# Patient Record
Sex: Male | Born: 1975 | Race: White | Hispanic: No | Marital: Single | Smoking: Never smoker
Health system: Southern US, Community
[De-identification: ages and names within clinical notes are randomized; demographics above are authoritative.]

---

## 2010-11-01 ENCOUNTER — Emergency Department: Payer: Self-pay | Admitting: Internal Medicine

## 2011-02-07 ENCOUNTER — Emergency Department: Payer: Self-pay | Admitting: Emergency Medicine

## 2011-03-01 ENCOUNTER — Emergency Department: Payer: Self-pay | Admitting: Internal Medicine

## 2011-03-01 LAB — CBC
HGB: 15.5 g/dL (ref 13.0–18.0)
MCH: 32.8 pg (ref 26.0–34.0)
MCV: 96 fL (ref 80–100)
Platelet: 144 10*3/uL — ABNORMAL LOW (ref 150–440)
RBC: 4.71 10*6/uL (ref 4.40–5.90)
RDW: 12.9 % (ref 11.5–14.5)

## 2011-03-01 LAB — COMPREHENSIVE METABOLIC PANEL
Anion Gap: 10 (ref 7–16)
Bilirubin,Total: 0.2 mg/dL (ref 0.2–1.0)
Calcium, Total: 9.1 mg/dL (ref 8.5–10.1)
Chloride: 107 mmol/L (ref 98–107)
Co2: 26 mmol/L (ref 21–32)
Creatinine: 0.84 mg/dL (ref 0.60–1.30)
EGFR (Non-African Amer.): >60
Osmolality: 284 (ref 275–301)
Potassium: 4.4 mmol/L (ref 3.5–5.1)
SGOT(AST): 27 U/L (ref 15–37)
SGPT (ALT): 36 U/L

## 2011-03-01 LAB — URIC ACID: Uric Acid: 6.6 mg/dL (ref 3.5–7.2)

## 2011-03-01 LAB — URINALYSIS, COMPLETE
Bilirubin,UR: NEGATIVE
Blood: NEGATIVE
Glucose,UR: NEGATIVE mg/dL (ref 0–75)
Ketone: NEGATIVE
Ph: 7 (ref 4.5–8.0)
Protein: NEGATIVE

## 2011-03-22 ENCOUNTER — Emergency Department: Payer: Self-pay | Admitting: *Deleted

## 2011-09-24 ENCOUNTER — Emergency Department: Payer: Self-pay | Admitting: Emergency Medicine

## 2011-09-24 LAB — COMPREHENSIVE METABOLIC PANEL
Anion Gap: 10 (ref 7–16)
BUN: 14 mg/dL (ref 7–18)
Calcium, Total: 9.3 mg/dL (ref 8.5–10.1)
Chloride: 108 mmol/L — ABNORMAL HIGH (ref 98–107)
Co2: 24 mmol/L (ref 21–32)
EGFR (African American): >60
Potassium: 3.7 mmol/L (ref 3.5–5.1)
SGOT(AST): 17 U/L (ref 15–37)

## 2011-09-24 LAB — CBC
HGB: 14.9 g/dL (ref 13.0–18.0)
MCH: 31.7 pg (ref 26.0–34.0)
MCHC: 34.2 g/dL (ref 32.0–36.0)
MCV: 93 fL (ref 80–100)
Platelet: 197 10*3/uL (ref 150–440)
RBC: 4.7 10*6/uL (ref 4.40–5.90)

## 2012-02-02 IMAGING — CT CT HEAD WITHOUT CONTRAST
2 series · 16 of 30 positions shown, 20 images · non-contrast
Comparison: none

REASON FOR EXAM: seizure
COMMENTS:

PROCEDURE:     CT  - CT HEAD WITHOUT CONTRAST  - November 01, 2010  [DATE]
RESULT:     Comparison:  None
TECHNIQUE: Multiple axial images from the foramen magnum to the vertex were
obtained without IV contrast.

[Series 2: without · axial · non-contrast · 0.43mm/px · z∈[-133,-3]mm · 13 of 32 slices shown, 17 images]
[im 3/32  brain]
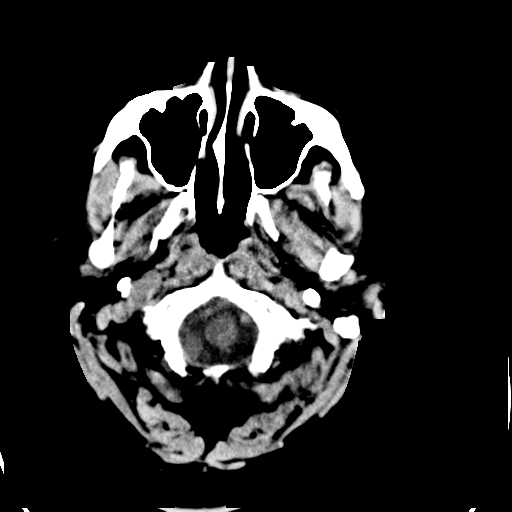
[im 3/32  bone]
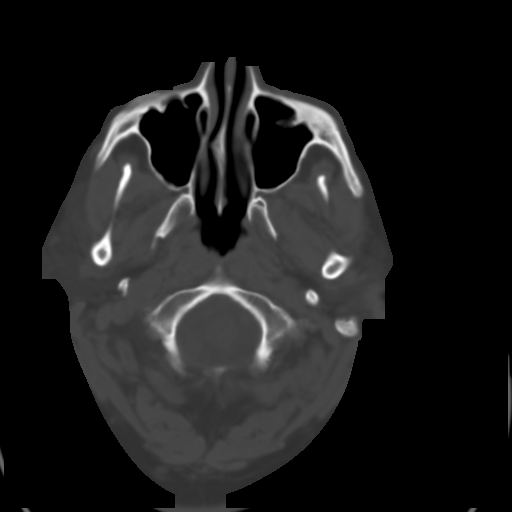
[im 5/32  brain]
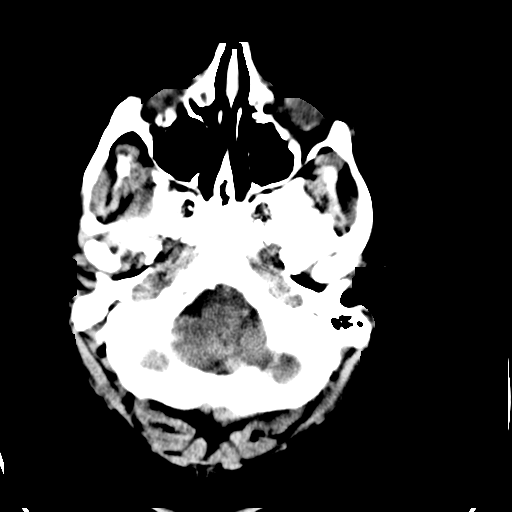
[im 7/32  brain]
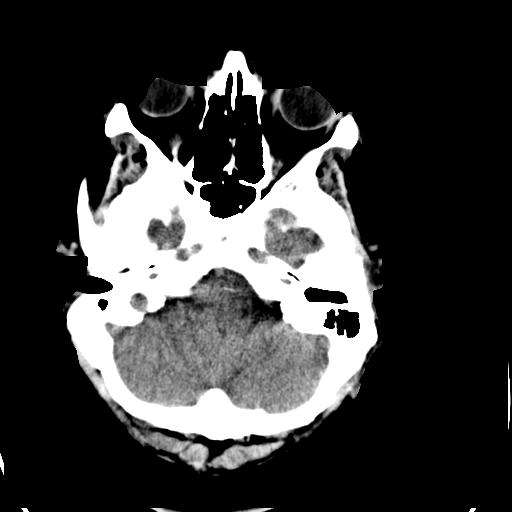
[im 9/32  brain]
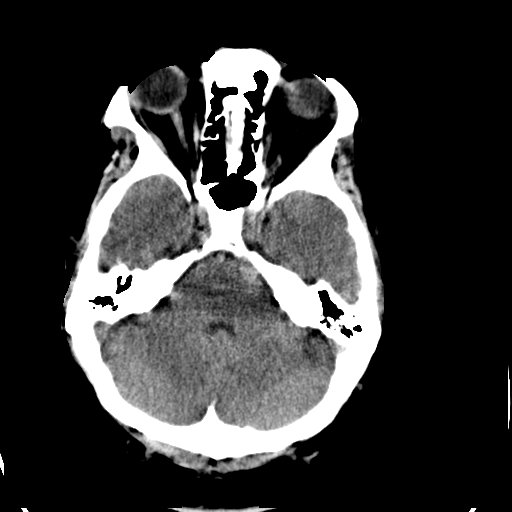
[im 12/32  brain]
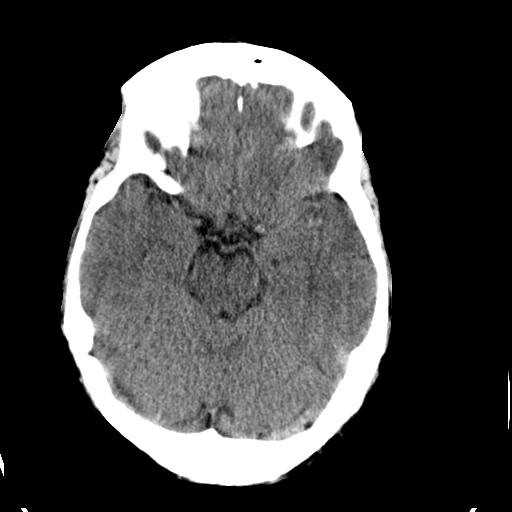
[im 12/32  bone]
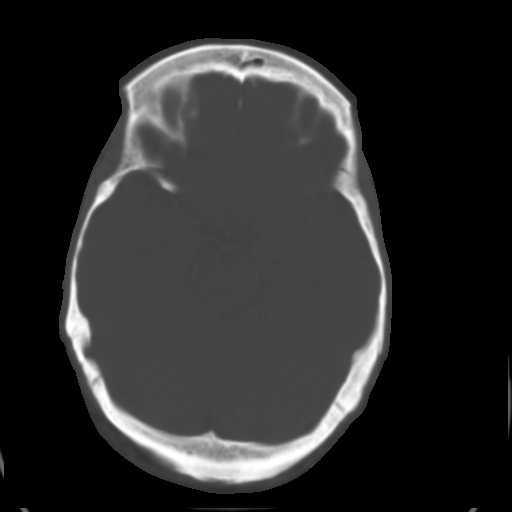
[im 14/32  brain]
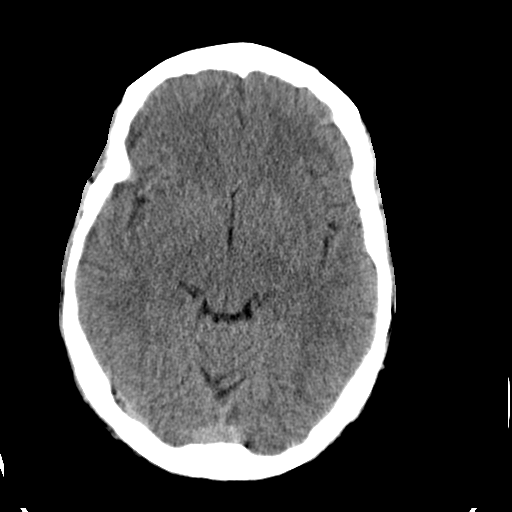
[im 16/32  brain]
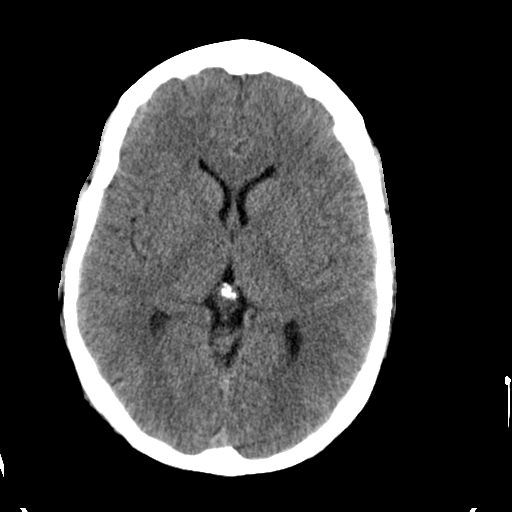
[im 18/32  brain]
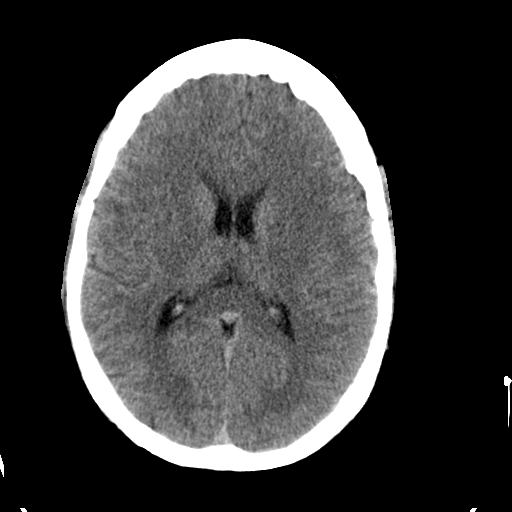
[im 20/32  brain]
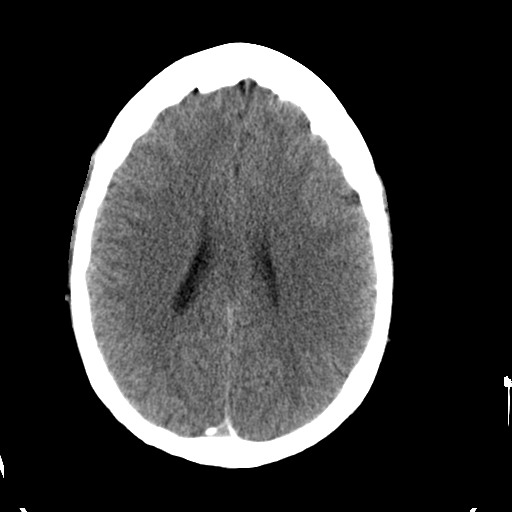
[im 20/32  bone]
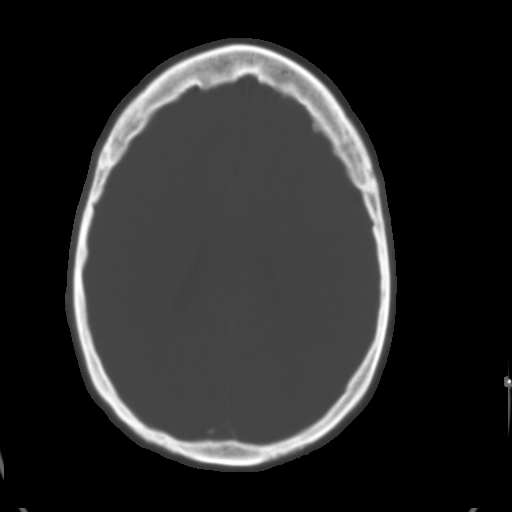
[im 23/32  brain]
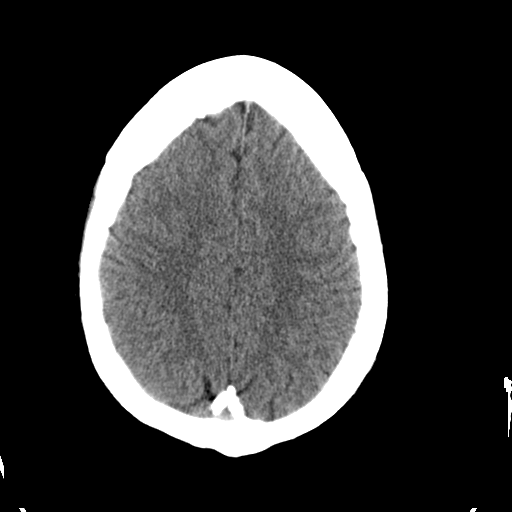
[im 25/32  brain]
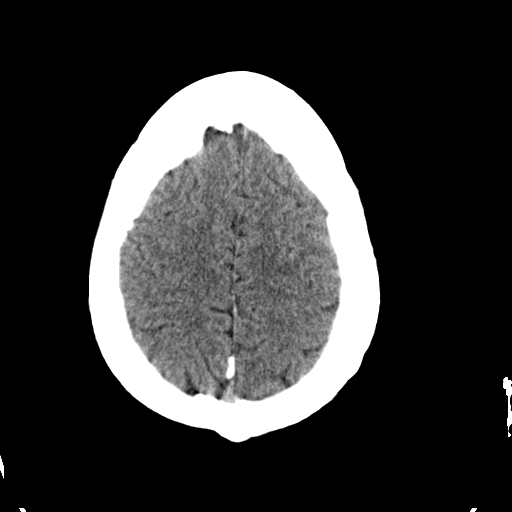
[im 27/32  brain]
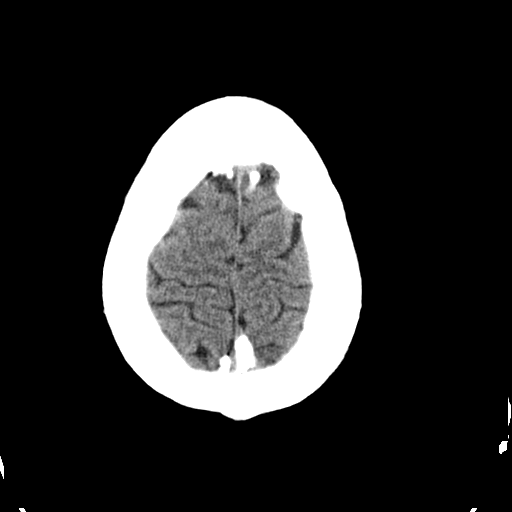
[im 29/32  brain]
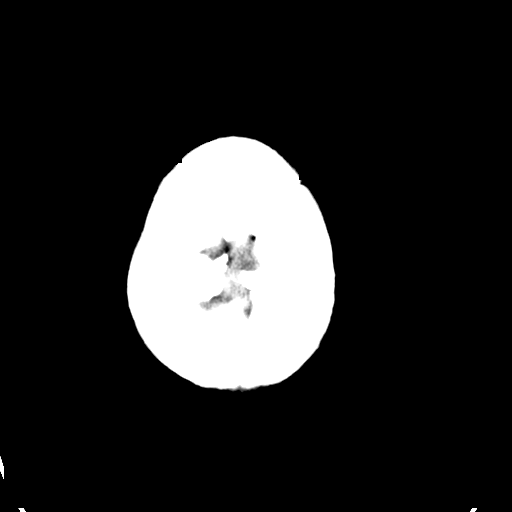
[im 29/32  bone]
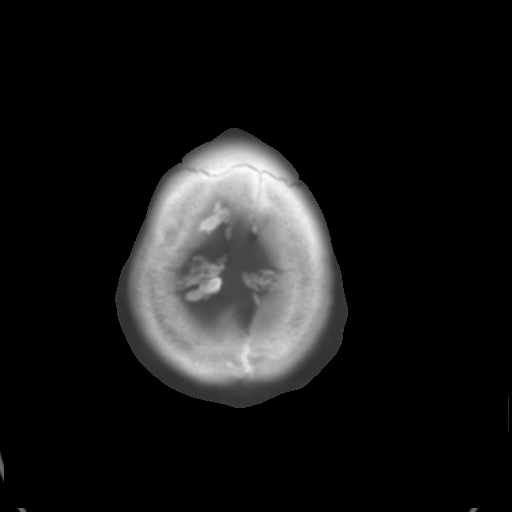

[Series 3: bone · axial · 0.43mm/px · z∈[-133,-88]mm · 3 of 32 slices shown]
[im 3/32  bone]
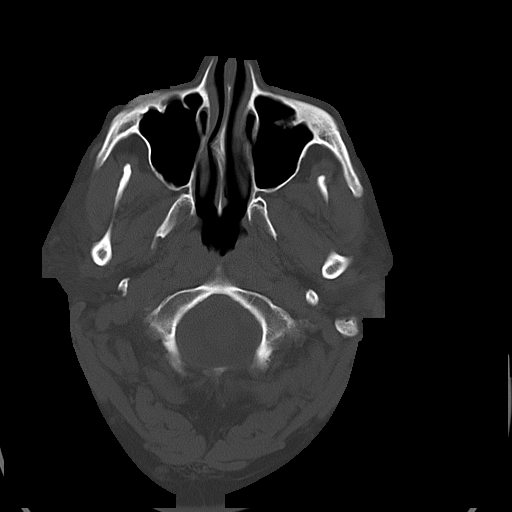
[im 7/32  bone]
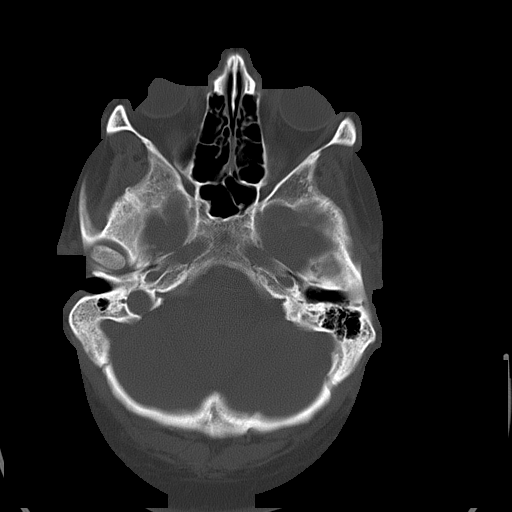
[im 12/32  bone]
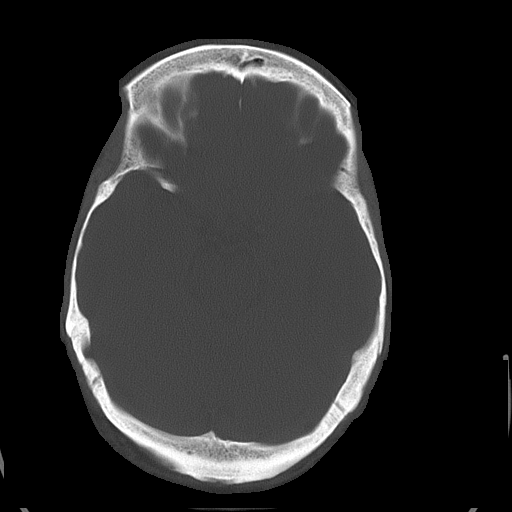

[16 of 30 positions shown; findings below may reference images not displayed]

FINDINGS: There is no evidence of mass effect, midline shift, or extra-axial fluid
collections.  There is no evidence of a space-occupying lesion or
intracranial hemorrhage. There is no evidence of a cortical-based area of
acute infarction.

The ventricles and sulci are appropriate for the patient's age. The basal
cisterns are patent.

Visualized portions of the orbits are unremarkable. The visualized portions
of the paranasal sinuses and mastoid air cells are unremarkable.

The osseous structures are unremarkable.
IMPRESSION: No acute intracranial process.

## 2012-06-01 IMAGING — CR DG CHEST 2V
1 series · 2 of 2 positions shown · non-contrast
Comparison: none

REASON FOR EXAM: sob
COMMENTS:

PROCEDURE:     DXR - DXR CHEST PA (OR AP) AND LATERAL  - March 01, 2011  [DATE]
RESULT:     The lungs are clear. The cardiac silhouette and visualized bony
skeleton are unremarkable.

[Series 1: w chest pa · 0.14mm/px · 2 of 2 slices shown]
[im 1/2]
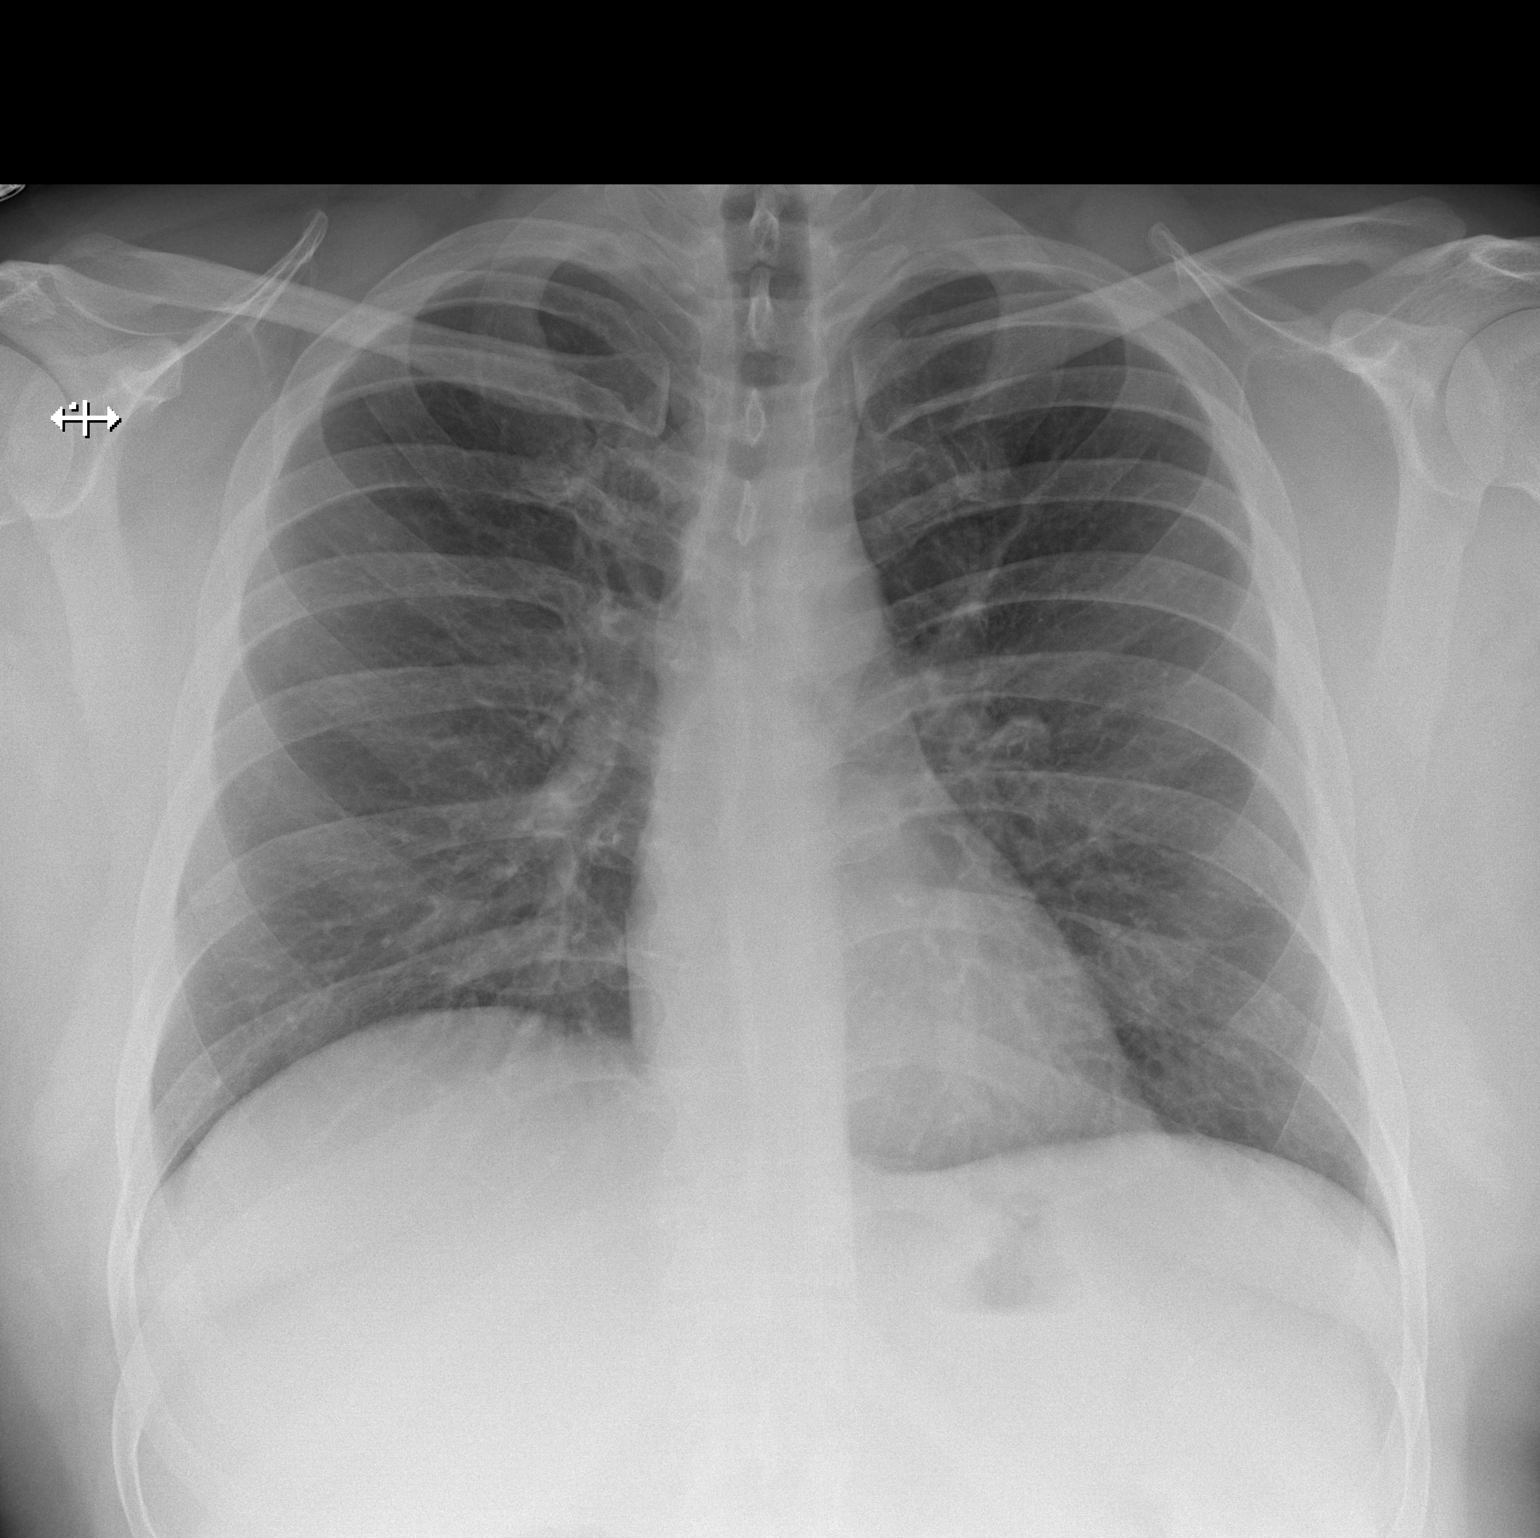
[im 2/2]
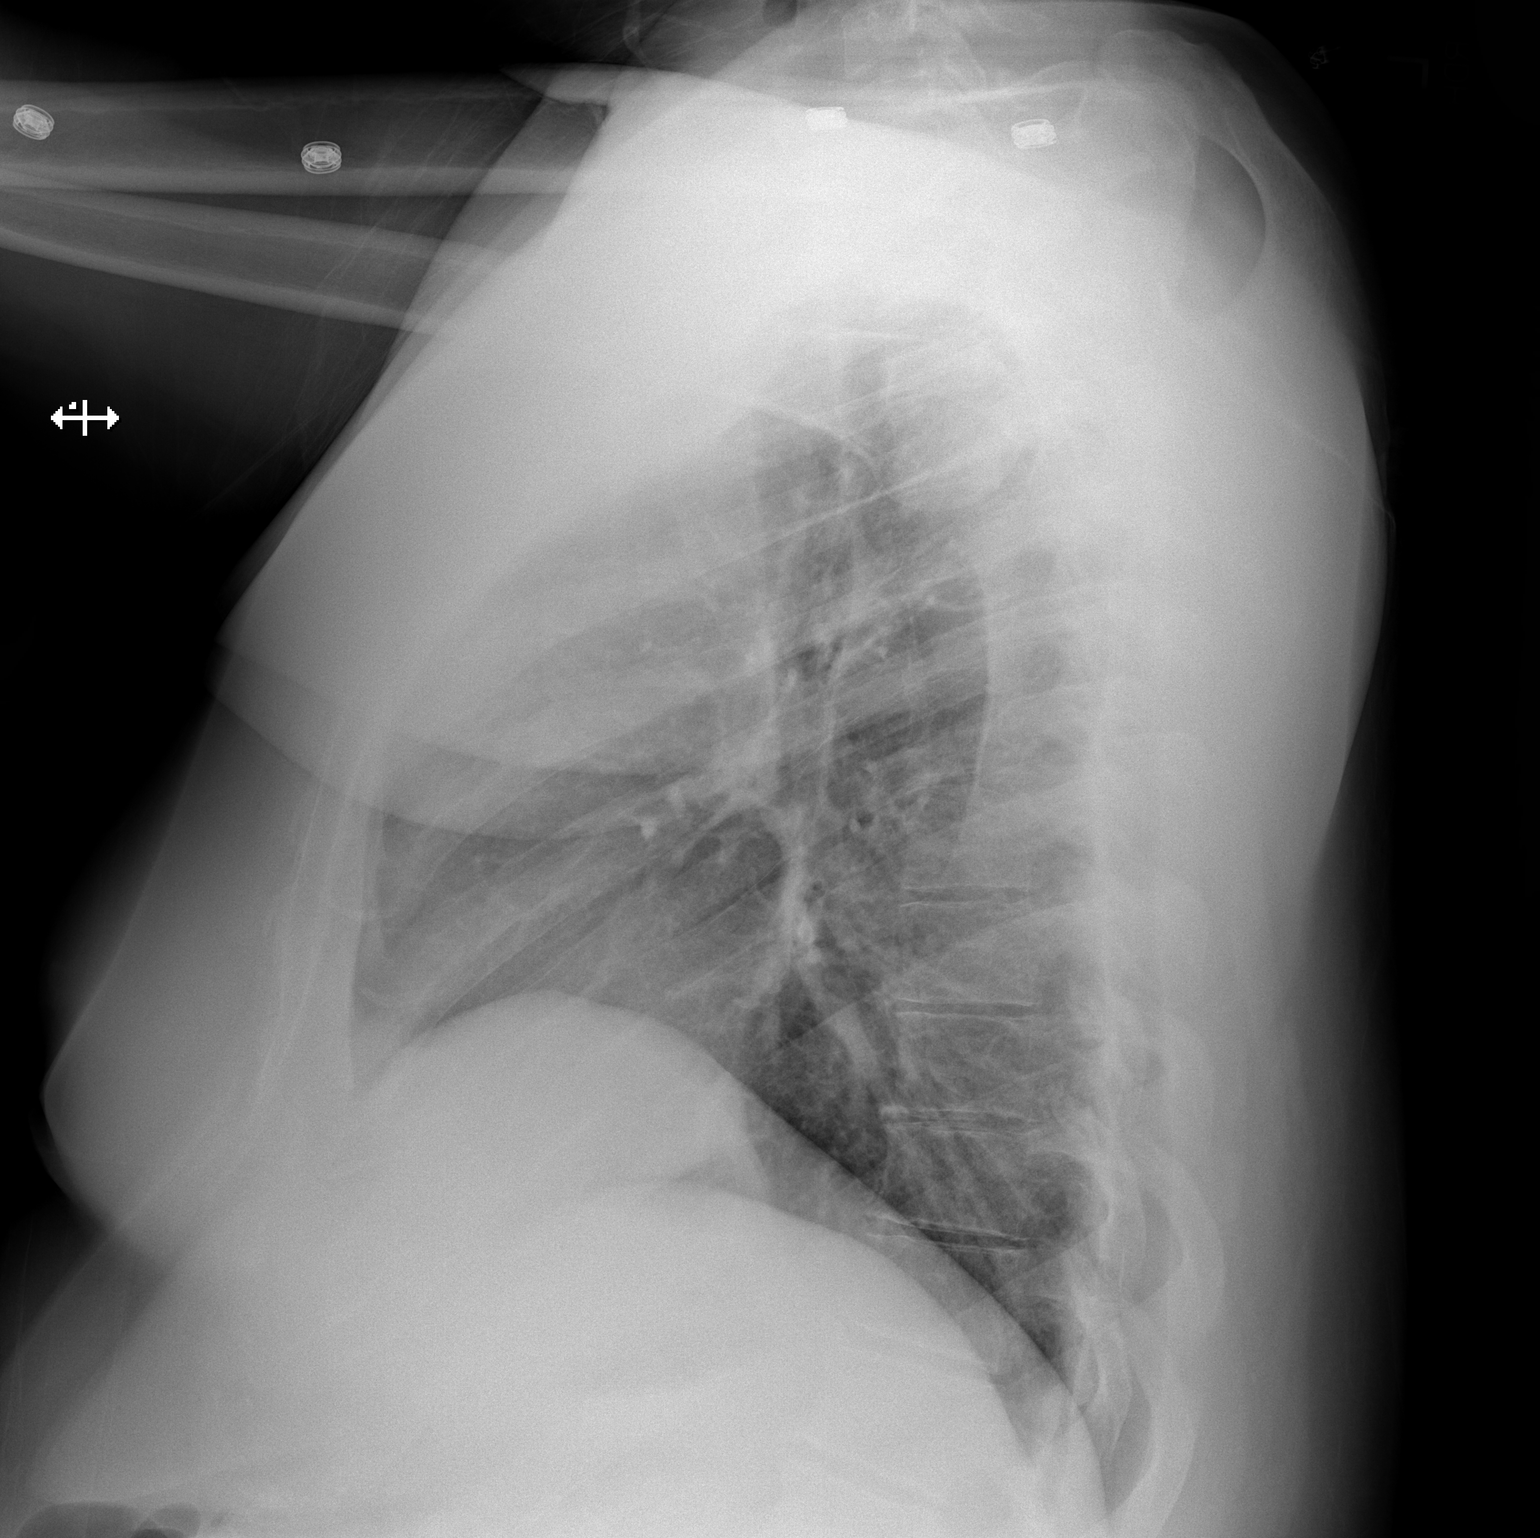

[2 of 2 positions shown; findings below may reference images not displayed]

IMPRESSION: 1. Chest radiograph without evidence of acute cardiopulmonary disease.

## 2012-06-07 ENCOUNTER — Ambulatory Visit: Payer: Self-pay | Admitting: Neurology

## 2013-03-17 ENCOUNTER — Observation Stay: Payer: Self-pay | Admitting: Pediatric Radiology

## 2013-03-17 LAB — CBC WITH DIFFERENTIAL/PLATELET
Basophil #: 0 10*3/uL (ref 0.0–0.1)
Basophil %: 0.2 %
EOS ABS: 0.3 10*3/uL (ref 0.0–0.7)
Eosinophil %: 1.9 %
HCT: 48 % (ref 40.0–52.0)
HGB: 15.8 g/dL (ref 13.0–18.0)
LYMPHS ABS: 4.3 10*3/uL — AB (ref 1.0–3.6)
Lymphocyte %: 26.7 %
MCH: 32.3 pg (ref 26.0–34.0)
MCHC: 32.8 g/dL (ref 32.0–36.0)
MCV: 99 fL (ref 80–100)
Monocyte #: 1.2 x10 3/mm — ABNORMAL HIGH (ref 0.2–1.0)
Monocyte %: 7.4 %
NEUTROS ABS: 10.4 10*3/uL — AB (ref 1.4–6.5)
Neutrophil %: 63.8 %
PLATELETS: 250 10*3/uL (ref 150–440)
RBC: 4.87 10*6/uL (ref 4.40–5.90)
RDW: 14.5 % (ref 11.5–14.5)
WBC: 16.2 10*3/uL — ABNORMAL HIGH (ref 3.8–10.6)

## 2013-03-17 LAB — BASIC METABOLIC PANEL
ANION GAP: 16 (ref 7–16)
BUN: 8 mg/dL (ref 7–18)
CREATININE: 1.21 mg/dL (ref 0.60–1.30)
Calcium, Total: 9.3 mg/dL (ref 8.5–10.1)
Chloride: 106 mmol/L (ref 98–107)
Co2: 21 mmol/L (ref 21–32)
EGFR (African American): >60
Glucose: 96 mg/dL (ref 65–99)
Osmolality: 283 (ref 275–301)
Potassium: 3.2 mmol/L — ABNORMAL LOW (ref 3.5–5.1)
SODIUM: 143 mmol/L (ref 136–145)

## 2013-03-17 LAB — CK: CK, Total: 123 U/L

## 2013-03-17 LAB — TSH: THYROID STIMULATING HORM: 2.15 u[IU]/mL

## 2013-03-18 ENCOUNTER — Ambulatory Visit: Payer: Self-pay | Admitting: Neurology

## 2014-05-26 NOTE — H&P (Signed)
PATIENT NAME:  [name] Reyes, [name] Reyes MR#:  [id number] DATE OF BIRTH:  Oct 31, 1975  DATE OF ADMISSION:  03/17/2013  PRESENTING COMPLAINT: Myoclonic jerks, possible recurrent seizures. History is obtained from the patient's father who is present in the Emergency Room.   [name] Reyes is a 39 year old Caucasian gentleman with history of hypothyroidism, GERD, history of clonic juvenile nonepileptic seizures, anxiety disorder, along with schizoaffective disorder, comes to the Emergency Room brought in by father with increasing myoclonic jerks and having possible tonic-clonic seizures. The patient's father was visiting from [place] to spend the day with him. The patient was upset earlier, at the group home, they took him morning medications including Topamax, they drove out to get some lunch, thereafter the patient started having increased myoclonic jerks. His father brought him into the Emergency Room. At that time he had slumped over the wheelchair and he was having likely tonic-clonic seizures. He received 2000 mg of IV Keppra. ER physician spoke with the patient's primary neurologist, Dr. Malvin Reyes and the patient is going to be admitted for overnight observation for now. The patient also received 2 mg of IV Ativan. He is sedated at this time and no more seizures have been reported.   PAST MEDICAL HISTORY:   1. Schizoaffective disorder.  2. Anxiety.  3. Clonic juvenile nonepileptic seizures.  4. GERD.  5. Hypothyroidism.   ALLERGIES: BARBITURATES, CODEINE, DILANTIN, GEODON, METHADONE, OPIOIDS AND SULFA DRUGS.   CURRENT MEDICATIONS: 1. Topamax 100 mg 3 tablets twice a day.  2. Simethicone 80 mg 4 times a day.  3. Potassium chloride 20 mEq p.o. every other day.  4. Olanzapine 20 mg b.i.d.  5. Naproxen 250 mg b.i.d.  6. Milk of mag p.o. daily.  7. Metformin 500 mg b.i.d.  8. Maalox 15 mL twice a day as needed.  9. Lidocaine 5% topical film apply to affected area daily.  10. Hyoscyamine  0.375 mg 1 capsule b.i.d.  11. Hydrocortisone 1% topical cream to affected area.  12. Furosemide 40 mg every other day. 13.  Fluphenazine 2.5 mg 2 tablets once a day at bedtime.  14. Eucerin topical cream, affected area.  15. Cymbalta 60 mg daily.  16. Clonazepam 1 mg every eight hours.  17. Acetaminophen 650 mg every six hours.   REVIEW OF SYSTEMS: Unobtainable. The patient is sedated with Keppra and Ativan he received earlier.   FAMILY HISTORY: Positive for hypertension.   SOCIAL HISTORY: The patient resides at the group home. He is a smoker, according to the father.   Denies any other drugs or alcohol as per father's knowledge.   PHYSICAL EXAMINATION: GENERAL: The patient is morbidly obese. He is sedated.  VITAL SIGNS: He is afebrile, pulse is 94, blood pressure is 119/72. Sats are 90% on room air.  HEENT: Atraumatic, normocephalic. Pupils PERRLA EOM intact. Oral mucosa is moist.  NECK: Supple. No JVD. No carotid bruit.  RESPIRATORY: Clear to auscultation bilaterally. No rales, rhonchi, respiratory distress or labored breathing.  CARDIOVASCULAR: Both heart sounds are normal. Rate, rhythm regular. PMI not lateralized. Chest nontender. Good pedal pulses, good femoral pulses. No lower extremity edema.  ABDOMEN: Obese, soft, nontender. No organomegaly. Positive bowel sounds.  NEUROLOGIC: Grossly intact cranial nerves II through XII. No motor or sensory deficit.  NEUROLOGICAL: Unable to assess. The patient is currently sedated with Ativan and Keppra received earlier. PSYCHIATRIC: The patient is sedated, unable to assess.  SKIN: Warm and dry.   LABORATORIES: Basic metabolic panel within normal limits except potassium of 3.2. CBC  within normal limits, except white count of 16.2.    ASSESSMENT AND PLAN: A 39 year old [name] Reyes with history of clonic  juvenile nonepileptic seizures, schizoaffective disorder, comes in with:  1. Tonic-clonic seizures myoclonic jerks. The patient  received 2000 mg of IV Keppra and 2 mg of IV Ativan. I will continue 1000 mg IV Keppra  b.i.d. We will have Dr. Malvin Reyes, the patient's primary neurologist see the patient in consultation. We will carry out seizure cautions, give p.r.n. Ativan if needed.  2. Schizoaffective disorder. The patient is on clonazepam, Cymbalta, fluphenazine and Olanzapine. We will resume once the patient is awake and alert.  3. Hypothyroidism. The patient is not on any Synthroid at this time. I will check the TSH today.  4. Deep vein thrombosis prophylaxis. Subcutaneous Lovenox.  5. History of tobacco abuse and chronic obstructive pulmonary disease. The patient is presently sedated. We will continue to monitor sats, which appeared to be stable at this time.   Above was discussed with the patient's father, who was present in the Emergency Room.   TIME SPENT: 50 minutes.    ____________________________ [name] HailSona A. [name] KatzPatel, MD sap:sg D: 03/17/2013 14:55:17 ET T: 03/17/2013 15:53:27 ET JOB#: [id number]  cc: [name] Reyes A. [name] KatzPatel, MD, <Dictator> [name] OraSONA A Mya Suell MD ELECTRONICALLY SIGNED 03/23/2013 17:13

## 2014-05-26 NOTE — Discharge Summary (Signed)
PATIENT NAME:  [name] Reyes, [name] MR#:  [id number] DATE OF BIRTH:  [birth year]  DATE OF ADMISSION:  03/17/2013  DATE OF DISCHARGE:  03/18/2013  ADMITTING DIAGNOSES:  Seizure, myoclonic jerks.  DISCHARGE DIAGNOSES:   1.  Seizure, with history of seizure disorder. The patient has been started on Keppra. He will follow with his neurologist as outpatient. 2.   Schizoaffective disorder. 3.  Anxiety. 4.  Gastroesophageal reflux disease. 5.  Hypothyroidism.  CONSULTANTS:  ED physician spoke to his neurologist, Dr. Malvin Reyes. The patient will be seen as an outpatient by him.   LABS:  TSH 2.15, CPK 123, WBC 16.2, hemoglobin 15.8, platelet count 250. Glucose 96, BUN 8, creatinine 1.21, sodium 143, potassium 3.2, chloride 106, CO2 is 21, calcium 9.3.   HOSPITAL COURSE:  Please refer to H&P done by the admitting physician.  The patient is a 39 year old white male with history of multiple medical problems, including hypothyroidism, GERD, history of juvenile nonepileptic seizure disorder, anxiety disorder, schizoaffective disorder, who came to the ED after he had increasing myoclonic jerks, had possible tonic-clonic seizures. The patient has been having increased stress recently, according to him. He was sitting in the ED waiting to be seen when he slumped over and was having tonic-clonic seizure activity due to these symptoms. The ED physician spoke to the patients primary neurologist, Dr. Malvin Reyes, who recommended IV Keppra. The patient was started on IV Keppra. Since being started on the Keppra, he has not had any further seizures. He was supposed to be seen by Dr. Malvin Reyes, but has not been seen yet. The patient is anxious to go home. At this time, he is stable for discharge. He will need to follow up with Dr. Malvin Reyes as an outpatient to decide whether he needs to continue both the Keppra and the Topamax. At this time, he is stable for discharge.  DISCHARGE MEDICATIONS:  Potassium chloride 20 mEq daily, Naproxen  250, 1 tab p.o. b.i.d. as needed for pain, simethicone 80, 1 tab p.o. 4 times a day as needed for gas, lidocaine patch as previously taken, levothyroxine 75 mcg daily, Oxybutynin 10 mg daily, Flomax 0.4 daily, Zegerid 40/1100 mg 1 tab p.o. daily, Duloxetine 60 daily, Detrol LA 4 mg daily, metformin extended release 500 daily, Citrucel 2 grams 1 tablespoon in 8 ounces of water daily, clonazepam 1 mg p.o. b.i.d., hyoscyamine 0.375, 1 tab p.o. b.i.d., Topamax 200, 1 tab p.o. b.i.d. with 100 mg totalling 300 mg, betamethasone/clotrimazole topically apply b.i.d., triamcinolone 0.5 apply to affected area b.i.d., olanzapine 2 tabs daily, (Dictation Anomaly) <<MISSING TEXT>>  2.5 mg 1-1/2 tabs at bedtime, Lasix 40, 1 tab p.o. daily as needed, Ventolin 2 puffs 4 x a day as needed, Tylenol Extra Strength 1 tab p.o. q. 8 p.r.n. for pain, benzoate 100, 1 tab p.o. t.i.d. p.r.n. cough, milk of magnesia 2 tablespoons as needed, hydrocortisone 1%, Eucerin apply to affected area b.i.d., Keppra 750 1 tab p.o. b.i.d.  INSTRUCTIONS: Diet: Low sodium, low fat, low cholesterol, carbohydrate-controlled diet. Activity as tolerated.  Follow with Dr. Malvin Reyes in 1-2 weeks. The patient was given precautions for seizure.  Thirty-five minutes spent on this discharge.    ____________________________ [name] SimasLauren J. Evola Hollis, MD lje:mr D: 03/18/2013 15:25:33 ET T: 03/18/2013 22:40:27 ET JOB#: [id number]  cc: [name] SimasLauren J. Jadore Mcguffin, MD, <Dictator>

## 2020-10-29 ENCOUNTER — Emergency Department (HOSPITAL_COMMUNITY)
Admission: EM | Admit: 2020-10-29 | Discharge: 2020-10-29 | Disposition: A | Discharge status: Home / Self Care | Payer: Medicare Other | Attending: Emergency Medicine | Admitting: Emergency Medicine

## 2020-10-29 ENCOUNTER — Other Ambulatory Visit: Payer: Self-pay

## 2020-10-29 DIAGNOSIS — Z5321 Procedure and treatment not carried out due to patient leaving prior to being seen by health care provider: Secondary | ICD-10-CM | POA: Diagnosis not present

## 2020-10-29 DIAGNOSIS — R1031 Right lower quadrant pain: Secondary | ICD-10-CM | POA: Insufficient documentation

## 2020-10-29 LAB — URINALYSIS, ROUTINE W REFLEX MICROSCOPIC
Bilirubin Urine: NEGATIVE
Glucose, UA: NEGATIVE mg/dL
Hgb urine dipstick: NEGATIVE
Ketones, ur: NEGATIVE mg/dL
Leukocytes,Ua: NEGATIVE
Nitrite: NEGATIVE
Protein, ur: NEGATIVE mg/dL
Specific Gravity, Urine: 1.025 (ref 1.005–1.030)
pH: 6 (ref 5.0–8.0)

## 2020-10-29 NOTE — ED Triage Notes (Signed)
Pt complains of right sided groin pain x 3 months.

## 2021-02-21 ENCOUNTER — Encounter (HOSPITAL_COMMUNITY): Payer: Self-pay

## 2021-02-21 ENCOUNTER — Other Ambulatory Visit: Payer: Self-pay

## 2021-02-21 ENCOUNTER — Emergency Department (HOSPITAL_COMMUNITY)
Admission: EM | Admit: 2021-02-21 | Discharge: 2021-02-21 | Disposition: A | Discharge status: Home / Self Care | Payer: Medicare Other | Attending: Emergency Medicine | Admitting: Emergency Medicine

## 2021-02-21 DIAGNOSIS — Z20822 Contact with and (suspected) exposure to covid-19: Secondary | ICD-10-CM | POA: Insufficient documentation

## 2021-02-21 DIAGNOSIS — Z5321 Procedure and treatment not carried out due to patient leaving prior to being seen by health care provider: Secondary | ICD-10-CM | POA: Insufficient documentation

## 2021-02-21 LAB — RESP PANEL BY RT-PCR (FLU A&B, COVID) ARPGX2
Influenza A by PCR: NEGATIVE
Influenza B by PCR: NEGATIVE
SARS Coronavirus 2 by RT PCR: NEGATIVE

## 2021-02-21 NOTE — ED Triage Notes (Signed)
Pt reports exposure to covid by family member. Requesting evaluation.
